# Patient Record
Sex: Male | Born: 1960 | Race: White | Hispanic: No | Marital: Single | Smoking: Never smoker
Health system: Southern US, Community
[De-identification: ages and names within clinical notes are randomized; demographics above are authoritative.]

## PROBLEM LIST (undated history)

## (undated) DIAGNOSIS — I10 Essential (primary) hypertension: Secondary | ICD-10-CM

## (undated) DIAGNOSIS — J45909 Unspecified asthma, uncomplicated: Secondary | ICD-10-CM

---

## 2013-07-07 ENCOUNTER — Emergency Department: Payer: Self-pay | Admitting: Emergency Medicine

## 2013-07-07 LAB — DRUG SCREEN, URINE
Amphetamines, Ur Screen: NEGATIVE (ref ?–1000)
Barbiturates, Ur Screen: NEGATIVE (ref ?–200)
Benzodiazepine, Ur Scrn: NEGATIVE (ref ?–200)
Cannabinoid 50 Ng, Ur ~~LOC~~: NEGATIVE (ref ?–50)
Cocaine Metabolite,Ur ~~LOC~~: NEGATIVE (ref ?–300)
Methadone, Ur Screen: NEGATIVE (ref ?–300)
Opiate, Ur Screen: NEGATIVE (ref ?–300)
Phencyclidine (PCP) Ur S: NEGATIVE (ref ?–25)
Tricyclic, Ur Screen: NEGATIVE (ref ?–1000)

## 2013-07-07 LAB — URINALYSIS, COMPLETE
Bacteria: NONE SEEN
Bilirubin,UR: NEGATIVE
Glucose,UR: >=500 mg/dL (ref 0–75)
Leukocyte Esterase: NEGATIVE
Protein: 100
Specific Gravity: 1.032 (ref 1.003–1.030)
Squamous Epithelial: NONE SEEN

## 2013-07-07 LAB — TSH: Thyroid Stimulating Horm: 6.98 u[IU]/mL — ABNORMAL HIGH

## 2013-07-07 LAB — COMPREHENSIVE METABOLIC PANEL
Albumin: 4.1 g/dL (ref 3.4–5.0)
Alkaline Phosphatase: 192 U/L — ABNORMAL HIGH (ref 50–136)
BUN: 17 mg/dL (ref 7–18)
Bilirubin,Total: 0.5 mg/dL (ref 0.2–1.0)
Chloride: 103 mmol/L (ref 98–107)
Co2: 23 mmol/L (ref 21–32)
EGFR (African American): >60
EGFR (Non-African Amer.): >60
Potassium: 3.9 mmol/L (ref 3.5–5.1)
SGPT (ALT): 20 U/L (ref 12–78)
Sodium: 135 mmol/L — ABNORMAL LOW (ref 136–145)
Total Protein: 7.8 g/dL (ref 6.4–8.2)

## 2013-07-07 LAB — CBC
HCT: 45.7 % (ref 40.0–52.0)
MCH: 32 pg (ref 26.0–34.0)
MCHC: 35.7 g/dL (ref 32.0–36.0)
Platelet: 210 10*3/uL (ref 150–440)
WBC: 8 10*3/uL (ref 3.8–10.6)

## 2013-07-07 LAB — ETHANOL
Ethanol %: <0.003 % (ref 0.000–0.080)
Ethanol: <3 mg/dL

## 2015-01-27 NOTE — Consult Note (Signed)
Brief Consult Note: Diagnosis: Adjustment disorder with depressed mood.   Patient was seen by consultant.   Consult note dictated.   Recommend further assessment or treatment.   Comments: Mr. William Castillo has no psychiatric past. He was brought by police on IVC from his GF of 13 years. She needed him removed from a hotel room so she could leave him. The patient denies suicidal or homicidal ideation. The GF has 50 B order.   PLAN: 1. The patient does not meet criteria for IVC. I will terminate proceedings. Please discharge asa ppropriate.   2. No medications recommended.   3. There is a bed at the homeless shelter for him tonight.  Electronic Signatures: Kristine LineaPucilowska, Jolanta (MD)  (Signed 02-Oct-14 17:46)  Authored: Brief Consult Note   Last Updated: 02-Oct-14 17:46 by Kristine LineaPucilowska, Jolanta (MD)

## 2015-01-27 NOTE — Consult Note (Signed)
PATIENT NAME:  Garen LahSANDERS, Kervin C MR#:  [id number] DATE OF BIRTH:  1961-09-18  DATE OF ADMISSION:  07/07/2013  DATE OF CONSULTATION:  07/08/2013  REQUESTING PHYSICIAN: Dr. Minna AntisKevin Paduchowski   CONSULTING PHYSICIAN: Shayaan Parke B. Phillip Sandler, MD  REASON FOR CONSULTATION: To evaluate a suicidal patient.   IDENTIFYING DATA:  Mr. Allyne GeeSanders is a 54 year old male with no past psychiatric history, per his own report.   CHIEF COMPLAINT:  "My girlfriend left me."   HISTORY OF PRESENT ILLNESS:  Mr. Allyne GeeSanders denies any psychiatric past. However, in an affidavit, his girlfriend reported that the patient has a history of bipolar disorder and OCD. The patient reports being on no medications. He was just left behind by a girlfriend of 13 years. Reportedly the couple was living at a hotel while the apartment was being renovated. The girlfriend did not indicate any problems in the relationship, and on the day of admission police showed up at his room and asked him to leave. The patient did not understand why the girlfriend was dumping him, and tried to talk to her, which was misunderstood as resisting arrest. Instead of to jail, the patient was brought to the Emergency Room. At no time did the patient indicate that he was suicidal or homicidal. However, in the petition it says that the patient said that he would harm his girlfriend. He denies any symptoms of depression, anxiety, or psychosis. He denies alcohol or illicit substance use. He adamantly denies any thoughts of hurting himself. He wants to leave. He denies thoughts of hurting other people. He says that he never, ever in his life hurt a human or animal.   PAST PSYCHIATRIC HISTORY:  None reported.   FAMILY HISTORY:  None reported.  PAST MEDICAL HISTORY:  Borderline diabetes, obesity.   ALLERGIES: No known drug allergies.   MEDICATIONS ON ADMISSION:  None.   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 He has not been employed for several years. He has been in a relationship with this woman for 13 years. He does not report any domestic violence. He is now homeless. We checked with the hotel. His belongings disappeared. He is alone, does not have any family or friends.   REVIEW OF SYSTEMS: CONSTITUTIONAL: No fevers or chills. Positive for some weight loss precipitated by the information that he may have borderline diabetes. He needs to lose more weight, he says. EYES:  No double or blurred vision.  EARS, NOSE, THROAT:  No hearing loss.  RESPIRATORY:  No shortness of breath or cough.  CARDIOVASCULAR:  No chest pain or orthopnea.  GASTROINTESTINAL: No abdominal pain, nausea, vomiting, or diarrhea.  GENITOURINARY: No incontinence or frequency.  ENDOCRINE: No heat or cold intolerance.  LYMPHATIC: No anemia or easy bruising.  INTEGUMENTARY: No acne or rash.  MUSCULOSKELETAL: No muscle or joint pain.  NEUROLOGIC: No tingling or weakness.  PSYCHIATRIC:  See History of Present Illness for details.  PHYSICAL EXAMINATION: VITAL SIGNS:  Blood pressure 180/82, pulse 88, respirations 20, temperature 97.5.  GENERAL: This is an obese male in no acute distress.  The rest of the physical examination is deferred to his primary attending.   LABORATORY DATA: Chemistries within normal limits, except for blood glucose of 342 and sodium 135. Blood alcohol level is zero. LFTs within normal limits, except for alkaline phosphatase of 192. TSH 6.98. Urine tox screen is negative for substances. CBC within normal limits.  Urinalysis is not suggestive of urinary tract infection, except for glucose over 500.   MENTAL STATUS EXAMINATION: The patient is alert and oriented to person, place, time and somewhat situation. He is still in shock over what had happened. He was served 50B this afternoon. He has been trying to call his girlfriend all day today. She would not answer the phone. He now is not allowed to  talk to her anymore. He is slightly tearful, but in good composure. He is rather poorly groomed, with strong body odor. He maintains good eye contact. His speech is of normal rhythm, rate, and volume. Mood is anxious, but adamantly denies thoughts of hurting himself. Affect is tearful. Thought process is logical and goal-oriented. He denies thoughts of hurting others. There are no delusions or paranoia. There are no auditory or visual hallucinations. His cognition is grossly intact. He registers 3 out of 3 and recalls 3 out of 3 objects after 5 minutes. He can spell WORLD forwards and backwards. He knows the current president. His insight and judgment are fair.   SUICIDE RISK ASSESSMENT: This is a patient with no past psychiatric history, who just experienced a personal loss after girlfriend of 13 years left him. He is upset and saddened, but able to press on. He denies thoughts of hurting himself or others. He is looking for discharge planning, and is able to participate in discussions. He will most likely go to the homeless shelter. We were able to secure a bed for him tonight.   DIAGNOSES: AXIS I:  Adjustment disorder with depressed mood.  AXIS II:  Deferred.  AXIS III:  Diabetes, obesity.  AXIS IV:  Major loss.  AXIS V:  GAF 50.   PLAN:  1. The patient no longer meets criteria for involuntary inpatient psychiatric commitment. I will discontinue the petition. Please discharge as appropriate.  2.  No medications recommended.  3. The patient was given information about [facility].  4.  Will arrange transportation to the homeless shelter. We spoke with the Sport and exercise psychologist. They will make a bed available for him tonight.     ____________________________ Ellin Goodie. Jennet Maduro, MD jbp:mr D: 07/08/2013 17:36:06 ET T: 07/08/2013 19:14:10 ET JOB#: [id number]  cc: Deandria Klute B. Jennet Maduro, MD, <Dictator> Shari Prows MD ELECTRONICALLY SIGNED 07/15/2013 7:47

## 2018-03-31 ENCOUNTER — Other Ambulatory Visit: Payer: Self-pay

## 2018-03-31 ENCOUNTER — Emergency Department
Admission: EM | Admit: 2018-03-31 | Discharge: 2018-04-01 | Disposition: A | Discharge status: Home / Self Care | Payer: Self-pay | Attending: Emergency Medicine | Admitting: Emergency Medicine

## 2018-03-31 ENCOUNTER — Emergency Department: Payer: Self-pay

## 2018-03-31 ENCOUNTER — Encounter: Payer: Self-pay | Admitting: Emergency Medicine

## 2018-03-31 DIAGNOSIS — J45909 Unspecified asthma, uncomplicated: Secondary | ICD-10-CM | POA: Insufficient documentation

## 2018-03-31 DIAGNOSIS — N200 Calculus of kidney: Secondary | ICD-10-CM | POA: Insufficient documentation

## 2018-03-31 DIAGNOSIS — I10 Essential (primary) hypertension: Secondary | ICD-10-CM | POA: Insufficient documentation

## 2018-03-31 HISTORY — DX: Essential (primary) hypertension: I10

## 2018-03-31 HISTORY — DX: Unspecified asthma, uncomplicated: J45.909

## 2018-03-31 LAB — BASIC METABOLIC PANEL
Anion gap: 14 (ref 5–15)
BUN: 19 mg/dL (ref 6–20)
CALCIUM: 9.3 mg/dL (ref 8.9–10.3)
CO2: 19 mmol/L — ABNORMAL LOW (ref 22–32)
CREATININE: 0.96 mg/dL (ref 0.61–1.24)
Chloride: 105 mmol/L (ref 98–111)
GFR calc Af Amer: >60 mL/min (ref >60)
GLUCOSE: 147 mg/dL — AB (ref 70–99)
Potassium: 4 mmol/L (ref 3.5–5.1)
SODIUM: 138 mmol/L (ref 135–145)

## 2018-03-31 LAB — CBC
HCT: 44.6 % (ref 40.0–52.0)
Hemoglobin: 15.9 g/dL (ref 13.0–18.0)
MCH: 32.4 pg (ref 26.0–34.0)
MCHC: 35.5 g/dL (ref 32.0–36.0)
MCV: 91.1 fL (ref 80.0–100.0)
Platelets: 211 10*3/uL (ref 150–440)
RBC: 4.9 MIL/uL (ref 4.40–5.90)
RDW: 13.6 % (ref 11.5–14.5)
WBC: 9.8 10*3/uL (ref 3.8–10.6)

## 2018-03-31 MED ORDER — KETOROLAC TROMETHAMINE 30 MG/ML IJ SOLN
30.0000 mg | Freq: Once | INTRAMUSCULAR | Status: AC
  Administered 2018-03-31: 30 mg via INTRAVENOUS
  Filled 2018-03-31: qty 1

## 2018-03-31 MED ORDER — ONDANSETRON HCL 4 MG/2ML IJ SOLN
INTRAMUSCULAR | Status: AC
  Administered 2018-03-31: 4 mg via INTRAVENOUS
  Filled 2018-03-31: qty 2

## 2018-03-31 MED ORDER — HYDROMORPHONE HCL 1 MG/ML IJ SOLN
INTRAMUSCULAR | Status: AC
  Administered 2018-03-31: 0.5 mg via INTRAVENOUS
  Filled 2018-03-31: qty 1

## 2018-03-31 MED ORDER — OXYCODONE-ACETAMINOPHEN 5-325 MG PO TABS: 1.0000 | ORAL_TABLET | ORAL | 0 refills | Status: AC | PRN

## 2018-03-31 MED ORDER — HYDROMORPHONE HCL 1 MG/ML IJ SOLN
0.5000 mg | Freq: Once | INTRAMUSCULAR | Status: AC
  Administered 2018-03-31: 0.5 mg via INTRAVENOUS

## 2018-03-31 MED ORDER — TAMSULOSIN HCL 0.4 MG PO CAPS: 0.4000 mg | ORAL_CAPSULE | Freq: Every day | ORAL | 0 refills | Status: AC

## 2018-03-31 MED ORDER — MORPHINE SULFATE (PF) 4 MG/ML IV SOLN
4.0000 mg | Freq: Once | INTRAVENOUS | Status: AC
  Administered 2018-03-31: 4 mg via INTRAVENOUS
  Filled 2018-03-31: qty 1

## 2018-03-31 MED ORDER — SODIUM CHLORIDE 0.9 % IV BOLUS
1000.0000 mL | Freq: Once | INTRAVENOUS | Status: AC
  Administered 2018-03-31: 1000 mL via INTRAVENOUS

## 2018-03-31 MED ORDER — ONDANSETRON HCL 4 MG/2ML IJ SOLN
4.0000 mg | Freq: Once | INTRAMUSCULAR | Status: AC
  Administered 2018-03-31: 4 mg via INTRAVENOUS
  Filled 2018-03-31: qty 2

## 2018-03-31 MED ORDER — ONDANSETRON HCL 4 MG/2ML IJ SOLN
4.0000 mg | Freq: Once | INTRAMUSCULAR | Status: AC
  Administered 2018-03-31: 4 mg via INTRAVENOUS

## 2018-03-31 NOTE — ED Provider Notes (Signed)
[facility] Emergency Department Provider Note ____________________________________________   First MD Initiated Contact with Patient 03/31/18 2043     (approximate)  I have reviewed the triage vital signs and the nursing notes.   HISTORY  Chief Complaint Flank Pain and Back Pain  HPI William Castillo is a 57 y.o. male with a history of asthma and hypertension was presenting to the emergency department today with right-sided groin and right flank pain that started about 230 this afternoon.  However, he says that it suddenly worsened at about 5:30 PM and was associated with diaphoresis, nausea and vomiting.  He denies any history of kidney stones in himself or his family.  Says that he did have some pressure with urination as well.  Says that he has borderline blood pressure as well as diabetes but does not take any medication at this time.  Past Medical History:  Diagnosis Date  . Asthma   . Hypertension     There are no active problems to display for this patient.   History reviewed. No pertinent surgical history.  Prior to Admission medications   Not on File    Allergies Patient has no known allergies.  History reviewed. No pertinent family history.  Social History Social History   Tobacco Use  . Smoking status: Never Smoker  . Smokeless tobacco: Never Used  Substance Use Topics  . Alcohol use: Not on file  . Drug use: Not on file    Review of Systems  Constitutional: No fever/chills Eyes: No visual changes. ENT: No sore throat. Cardiovascular: Denies chest pain. Respiratory: Denies shortness of breath. Gastrointestinal: no vomiting.  No diarrhea.  No constipation. Genitourinary: As above Musculoskeletal: As above Skin: Negative for rash. Neurological: Negative for headaches, focal weakness or numbness.   ____________________________________________   PHYSICAL EXAM:  VITAL SIGNS: ED Triage Vitals  Enc Vitals Group     BP  03/31/18 2038 (!) 173/94     Pulse Rate 03/31/18 2038 92     Resp 03/31/18 2038 20     Temp 03/31/18 2038 98.5 F (36.9 C)     Temp Source 03/31/18 2038 Oral     SpO2 03/31/18 2038 99 %     Weight --      Height --      Head Circumference --      Peak Flow --      Pain Score 03/31/18 2101 10     Pain Loc --      Pain Edu? --      Excl. in GC? --     Constitutional: Alert and oriented.  Patient appears uncomfortable. Eyes: Conjunctivae are normal.  Head: Atraumatic. Nose: No congestion/rhinnorhea. Mouth/Throat: Mucous membranes are moist.  Neck: No stridor.   Cardiovascular: Normal rate, regular rhythm. Grossly normal heart sounds.   Respiratory: Normal respiratory effort.  No retractions. Lungs CTAB. Gastrointestinal: Soft and nontender. No distention. No CVA tenderness. Musculoskeletal: No lower extremity tenderness nor edema.  No joint effusions. Neurologic:  Normal speech and language. No gross focal neurologic deficits are appreciated. Skin:  Skin is warm, dry and intact. No rash noted. Psychiatric: Mood and affect are normal. Speech and behavior are normal.  ____________________________________________   LABS (all labs ordered are listed, but only abnormal results are displayed)  Labs Reviewed  BASIC METABOLIC PANEL - Abnormal; Notable for the following components:      Result Value   CO2 19 (*)    Glucose, Bld 147 (*)  All other components within normal limits  CBC  URINALYSIS, COMPLETE (UACMP) WITH MICROSCOPIC   ____________________________________________  EKG   ____________________________________________  RADIOLOGY  2 mm right UVJ calculus causing mild right hydronephrosis. ____________________________________________   PROCEDURES  Procedure(s) performed:   Procedures  Critical Care performed:   ____________________________________________   INITIAL IMPRESSION / ASSESSMENT AND PLAN / ED COURSE  Pertinent labs & imaging results that  were available during my care of the patient were reviewed by me and considered in my medical decision making (see chart for details).  Differential diagnosis includes, but is not limited to, acute appendicitis, renal colic, testicular torsion, urinary tract infection/pyelonephritis, prostatitis,  epididymitis, diverticulitis, small bowel obstruction or ileus, colitis, abdominal aortic aneurysm, gastroenteritis, hernia, etc. As part of my medical decision making, I reviewed the following data within the electronic MEDICAL RECORD NUMBERNo previous records for review  ----------------------------------------- 11:27 PM on 03/31/2018 -----------------------------------------  Patient at this time says that his pain is tolerable and that he does not want any more pain medicines.  He is not in any distress at this time.  Pending UA.  He will be receiving another liter of IV fluids.  Likely discharge to home.  Signed out to Dr. Lamont Snowballifenbark.  Patient will also need to wait several hours because he is likely to driving himself home. ____________________________________________   FINAL CLINICAL IMPRESSION(S) / ED DIAGNOSES  Kidney stone.  NEW MEDICATIONS STARTED DURING THIS VISIT:  New Prescriptions   No medications on file     Note:  This document was prepared using Dragon voice recognition software and may include unintentional dictation errors.     Myrna BlazerSchaevitz, Catharina Pica Matthew, MD 03/31/18 [phone]

## 2018-03-31 NOTE — ED Notes (Signed)
Pt reports he is still unable to provide urine sample.

## 2018-03-31 NOTE — ED Triage Notes (Signed)
Pt c/o lower right sided back pain radiating into right flank area x1 day. Pt denies N/V as well as urinary symptoms.

## 2018-03-31 NOTE — ED Notes (Signed)
CT called

## 2018-03-31 NOTE — ED Notes (Signed)
Patient transported to CT 

## 2018-04-01 LAB — URINALYSIS, COMPLETE (UACMP) WITH MICROSCOPIC
Bacteria, UA: NONE SEEN
Bilirubin Urine: NEGATIVE
GLUCOSE, UA: 50 mg/dL — AB
Ketones, ur: 20 mg/dL — AB
LEUKOCYTES UA: NEGATIVE
NITRITE: NEGATIVE
PH: 5 (ref 5.0–8.0)
Protein, ur: NEGATIVE mg/dL
Specific Gravity, Urine: 1.019 (ref 1.005–1.030)
Squamous Epithelial / LPF: NONE SEEN (ref 0–5)

## 2018-04-01 MED ORDER — OXYCODONE-ACETAMINOPHEN 5-325 MG PO TABS
1.0000 | ORAL_TABLET | Freq: Once | ORAL | Status: AC
  Administered 2018-04-01: 1 via ORAL

## 2018-04-01 MED ORDER — OXYCODONE-ACETAMINOPHEN 5-325 MG PO TABS
ORAL_TABLET | ORAL | Status: AC
  Filled 2018-04-01: qty 1

## 2019-10-09 IMAGING — CT CT RENAL STONE PROTOCOL
2 of 4 series · 17 of 46 positions shown, 19 images · non-contrast
Comparison: None.

CLINICAL DATA: 57-year-old male with acute RIGHT abdominal, pelvic
and flank pain for 1 day.

EXAM:
CT ABDOMEN AND PELVIS WITHOUT CONTRAST
TECHNIQUE: Multidetector CT imaging of the abdomen and pelvis was performed
following the standard protocol without IV contrast.

[Series 2: stone full standard · axial · 0.93mm/px · z∈[-1121,-676]mm · 14 of 99 slices shown, 16 images]
[im 5/99  soft-tissue]
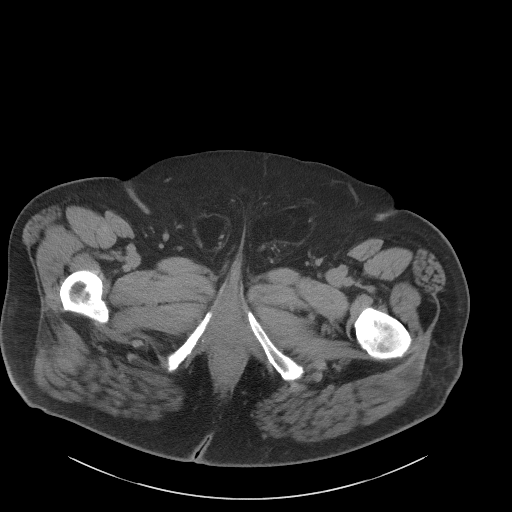
[im 5/99  bone]
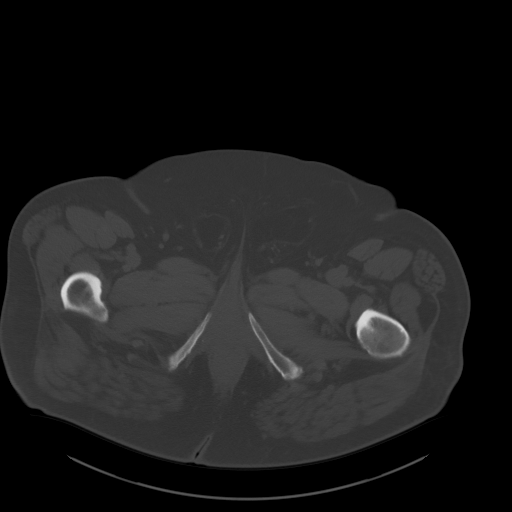
[im 13/99  soft-tissue]
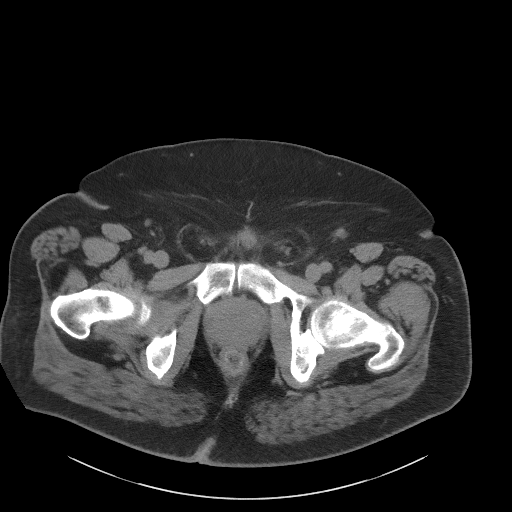
[im 21/99  soft-tissue]
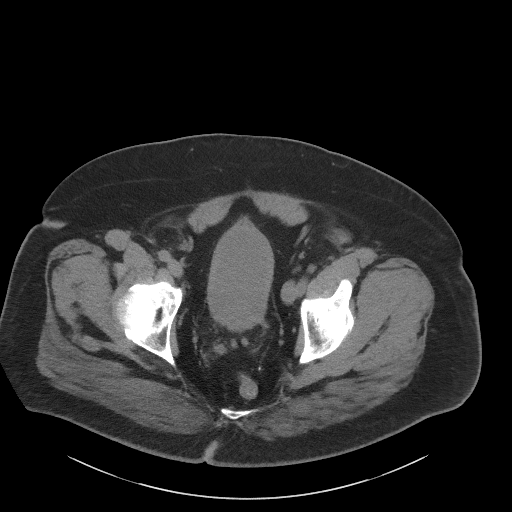
[im 25/99  soft-tissue]
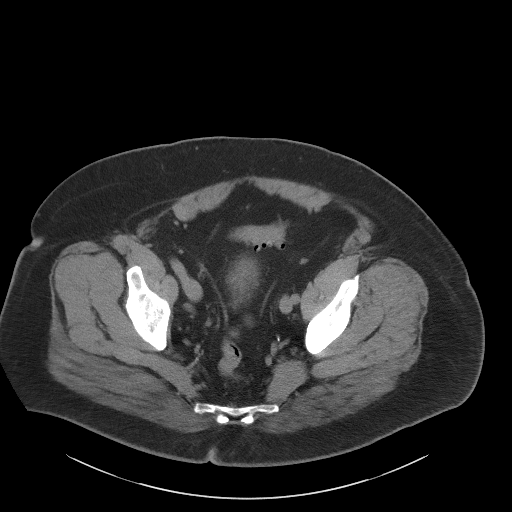
[im 33/99  soft-tissue]
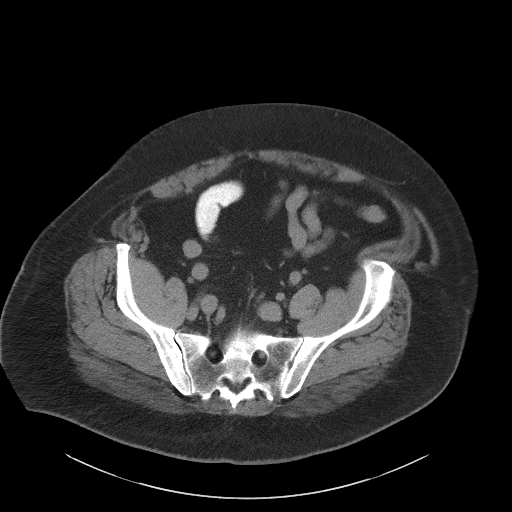
[im 41/99  soft-tissue]
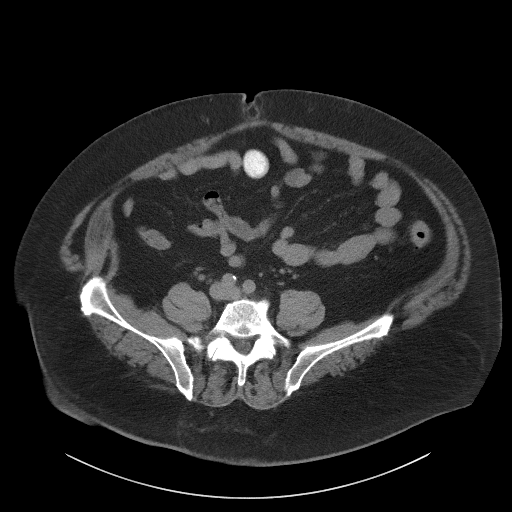
[im 45/99  soft-tissue]
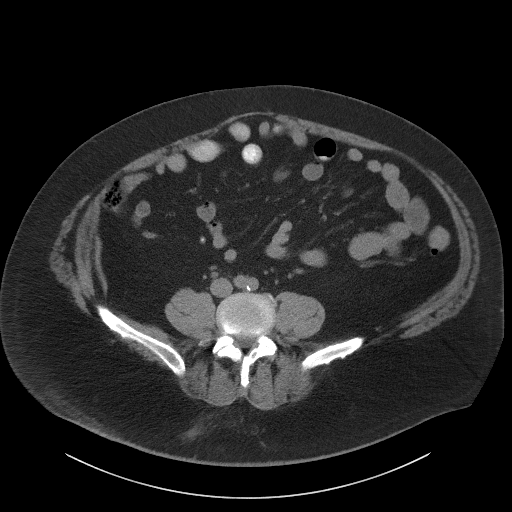
[im 54/99  soft-tissue]
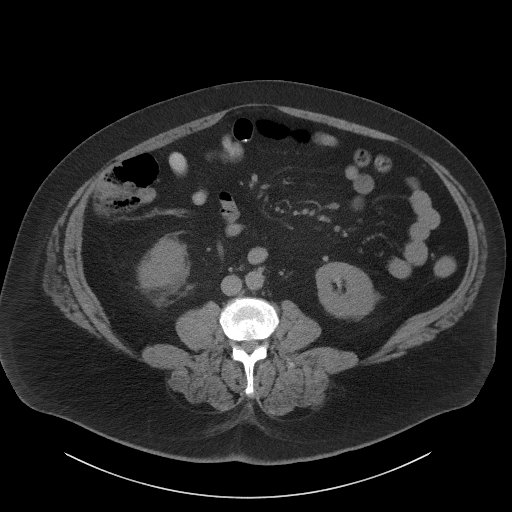
[im 58/99  soft-tissue]
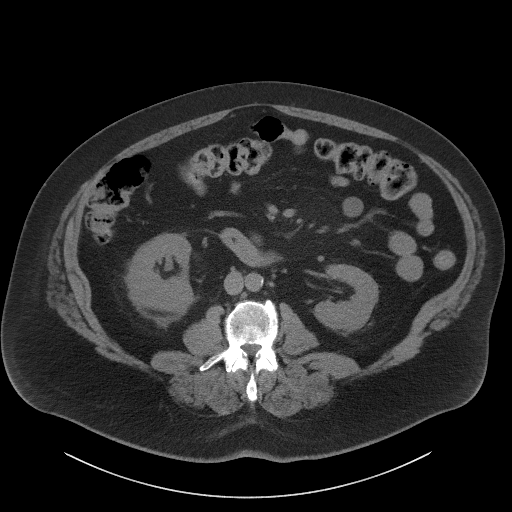
[im 58/99  bone]
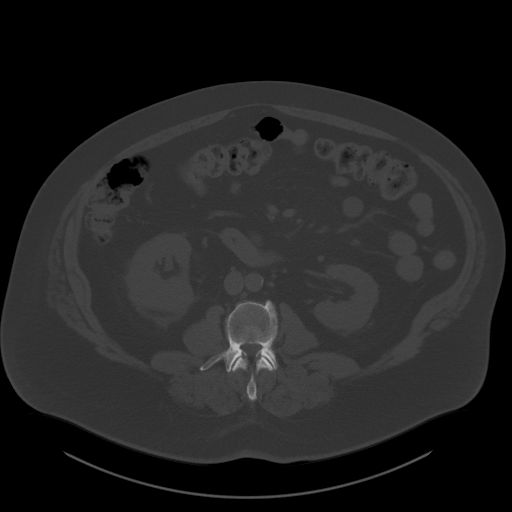
[im 66/99  soft-tissue]
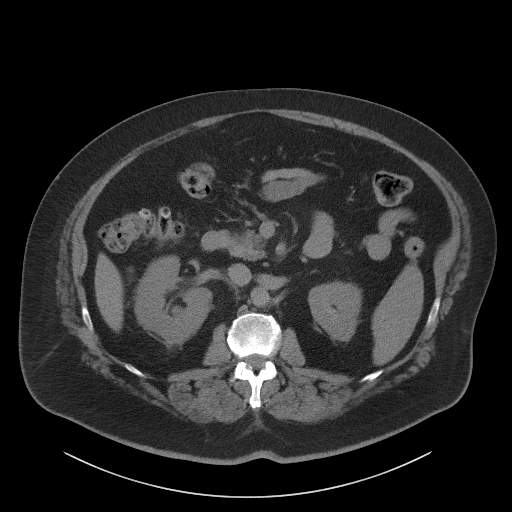
[im 74/99  soft-tissue]
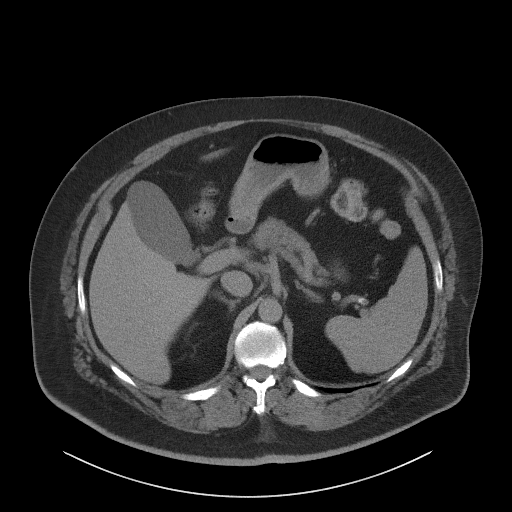
[im 78/99  soft-tissue]
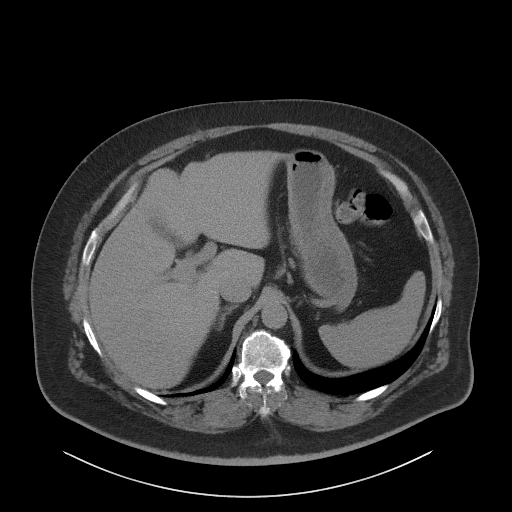
[im 86/99  soft-tissue]
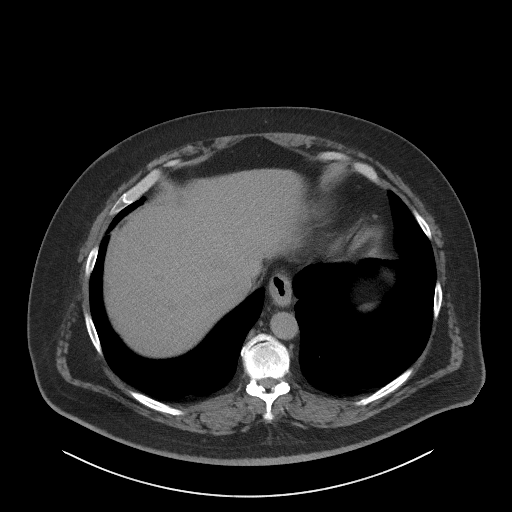
[im 94/99  soft-tissue]
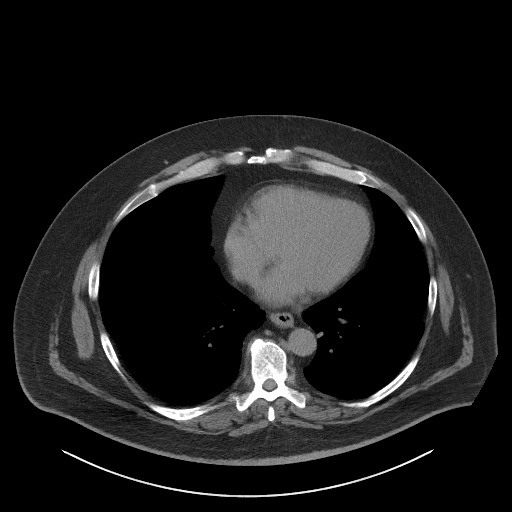

[Series 5: coronal · coronal · 0.95mm/px · 3 of 173 slices shown]
[im 58/173  soft-tissue]
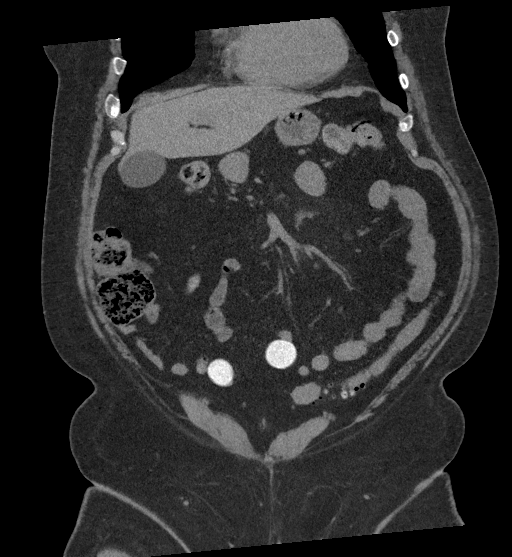
[im 77/173  soft-tissue]
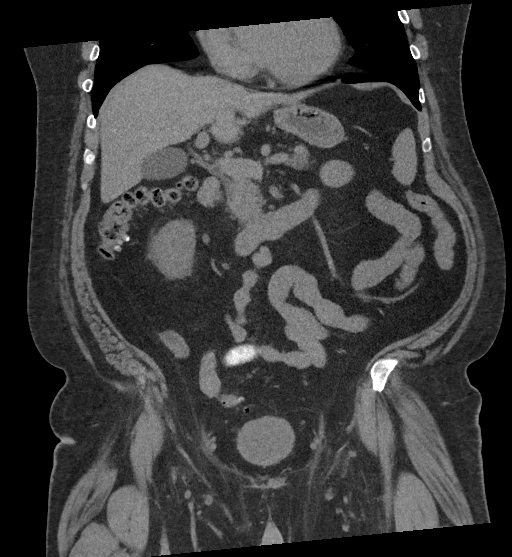
[im 96/173  soft-tissue]
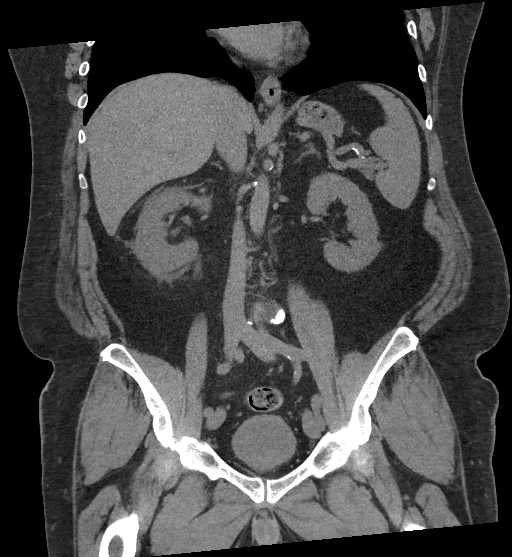

[17 of 46 positions shown; findings below may reference images not displayed]

FINDINGS: Please note that parenchymal abnormalities may be missed without
intravenous contrast.

Lower chest: No acute abnormality.

Hepatobiliary: The liver and gallbladder are unremarkable. No
biliary dilatation.

Pancreas: Unremarkable.

Spleen: Unremarkable.

Adrenals/Urinary Tract: A 2 mm RIGHT UVJ calculus causes mild RIGHT
hydronephrosis and perinephric inflammation. The LEFT kidney,
adrenal glands and bladder are otherwise unremarkable.

Stomach/Bowel: Stomach is within normal limits. Appendix appears
normal. No evidence of bowel wall thickening, distention, or
inflammatory changes. Colonic diverticulosis noted without evidence
of diverticulitis.

Vascular/Lymphatic: Aortic atherosclerosis. No enlarged abdominal or
pelvic lymph nodes.

Reproductive: Prostate enlargement noted

Other: No ascites, focal collection or pneumoperitoneum. Moderate
bilateral inguinal hernias containing fat are identified.

Musculoskeletal: No acute or suspicious bony abnormalities.
IMPRESSION: 1. 2 mm RIGHT UVJ calculus causing mild RIGHT hydronephrosis and
perinephric inflammation.
2. Bilateral inguinal hernias containing fat
3.  Aortic Atherosclerosis ([id number]).
